# Patient Record
Sex: Male | Born: 1975 | Race: Black or African American | Hispanic: No | Marital: Single | Smoking: Never smoker
Health system: Southern US, Community
[De-identification: ages and names within clinical notes are randomized; demographics above are authoritative.]

## PROBLEM LIST (undated history)

## (undated) DIAGNOSIS — I10 Essential (primary) hypertension: Secondary | ICD-10-CM

## (undated) DIAGNOSIS — E119 Type 2 diabetes mellitus without complications: Secondary | ICD-10-CM

## (undated) DIAGNOSIS — E785 Hyperlipidemia, unspecified: Secondary | ICD-10-CM

---

## 2013-10-05 ENCOUNTER — Encounter (HOSPITAL_BASED_OUTPATIENT_CLINIC_OR_DEPARTMENT_OTHER): Payer: Self-pay | Admitting: Emergency Medicine

## 2013-10-05 ENCOUNTER — Emergency Department (HOSPITAL_BASED_OUTPATIENT_CLINIC_OR_DEPARTMENT_OTHER)
Admission: EM | Admit: 2013-10-05 | Discharge: 2013-10-05 | Disposition: A | Discharge status: Home / Self Care | Payer: Self-pay | Attending: Emergency Medicine | Admitting: Emergency Medicine

## 2013-10-05 ENCOUNTER — Emergency Department (HOSPITAL_BASED_OUTPATIENT_CLINIC_OR_DEPARTMENT_OTHER): Payer: Self-pay

## 2013-10-05 DIAGNOSIS — S71009A Unspecified open wound, unspecified hip, initial encounter: Secondary | ICD-10-CM | POA: Insufficient documentation

## 2013-10-05 DIAGNOSIS — W460XXA Contact with hypodermic needle, initial encounter: Secondary | ICD-10-CM | POA: Insufficient documentation

## 2013-10-05 DIAGNOSIS — Y9389 Activity, other specified: Secondary | ICD-10-CM | POA: Insufficient documentation

## 2013-10-05 DIAGNOSIS — S71109A Unspecified open wound, unspecified thigh, initial encounter: Principal | ICD-10-CM | POA: Insufficient documentation

## 2013-10-05 DIAGNOSIS — E119 Type 2 diabetes mellitus without complications: Secondary | ICD-10-CM | POA: Insufficient documentation

## 2013-10-05 DIAGNOSIS — Y9289 Other specified places as the place of occurrence of the external cause: Secondary | ICD-10-CM | POA: Insufficient documentation

## 2013-10-05 DIAGNOSIS — Z79899 Other long term (current) drug therapy: Secondary | ICD-10-CM | POA: Insufficient documentation

## 2013-10-05 DIAGNOSIS — T148XXA Other injury of unspecified body region, initial encounter: Secondary | ICD-10-CM

## 2013-10-05 HISTORY — DX: Type 2 diabetes mellitus without complications: E11.9

## 2013-10-05 NOTE — ED Provider Notes (Signed)
CSN: [id number]     Arrival date & time 10/05/13  1902 History   This chart was scribed for Ethelda Chick, MD, by Yevette Edwards, ED Scribe. This patient was seen in room [facility]FT2/[facility]2 and the patient's care was started at 9:06 PM.  First MD Initiated Contact with Patient 10/05/13 2014     Chief Complaint  Patient presents with  . Foreign Body in Skin    Patient is a 38 y.o. male presenting with foreign body. The history is provided by the patient. No language interpreter was used.  Foreign Body Location:  Skin Suspected object:  Metal Pain quality:  Unable to specify Pain severity:  No pain Timing:  Unable to specify Progression:  Unable to specify Chronicity:  New Worsened by:  Nothing tried Ineffective treatments:  None tried   HPI Comments: Christopherjame Amsden is a 38 y.o. male, with a h/o DM, who presents to the Emergency Department complaining of a suspected foreign body to his left deltoid which occurred this evening when the tip of a needle ostensibly fractured. He reports the needle was used for an testosterone injection.  Mr. Zou denies any pain associated with the sight.   Past Medical History  Diagnosis Date  . Diabetes mellitus without complication    History reviewed. No pertinent past surgical history. No family history on file. History  Substance Use Topics  . Smoking status: Never Smoker   . Smokeless tobacco: Not on file  . Alcohol Use: No    Review of Systems  Musculoskeletal: Negative for myalgias.  All other systems reviewed and are negative.   Allergies  Review of patient's allergies indicates no known allergies.  Home Medications   Prior to Admission medications   Medication Sig Start Date End Date Taking? Authorizing Provider  LISINOPRIL PO Take by mouth.   Yes Historical Provider, MD  METFORMIN HCL PO Take by mouth.   Yes Historical Provider, MD  TESTOSTERONE IM Inject into the muscle.   Yes Historical Provider, MD   BP 151/74  Pulse 79   Temp(Src) 98.1 F (36.7 C) (Oral)  Resp 20  SpO2 98% Vitals reviewed Physical Exam Physical Examination: General appearance - alert, well appearing, and in no distress Mental status - alert, oriented to person, place, and time Eyes - no conjunctival injection, no scleral icterus Neurological - alert, oriented,  Strength 5/5 in upper extremities, sensation intact distal to area of injection on left deltoid Musculoskeletal - no joint tenderness, deformity or swelling Extremities - peripheral pulses normal, no pedal edema, no clubbing or cyanosis Skin - normal coloration and turgor, no rashes, no erythema, no bleeding, no foreign body seen or palpated  ED Course  Procedures (including critical care time)  DIAGNOSTIC STUDIES: Oxygen Saturation is 98% on RA, normal by my interpretation.    COORDINATION OF CARE:  9:08 PM- Discussed treatment plan with patient, and the patient agreed to the plan.  The plan includes imaging.   Labs Review Labs Reviewed - No data to display  Imaging Review Dg Humerus Left  10/05/2013   CLINICAL DATA:  The patient states that a TB needle broke off in his arm while getting a testosterone injection.  EXAM: LEFT HUMERUS - 2+ VIEW  COMPARISON:  None.  FINDINGS: A small metallic marker is a marking the injection site. There is no radiopaque foreign body within the soft tissues. The underlying bones have normal appearances.  IMPRESSION: Normal examination.  No radiopaque foreign body.   Electronically Signed  By: Gordan Payment M.D.   On: 10/05/2013 19:45     EKG Interpretation None      MDM   Final diagnoses:  Foreign body in skin    Pt presenting with c/o testosterone needle in his left deltoid.  No foreign body seen or palpated under skin, no foreign body seen on xray-  Xray images reviewed and interpreted by me as well.  Pt provided with reassurance, given information for surgery followup if he develops pain, increased symptoms redness at site.   Discharged with strict return precautions.  Pt agreeable with plan.  I personally performed the services described in this documentation, which was scribed in my presence. The recorded information has been reviewed and is accurate.     Ethelda Chick, MD 10/05/13 2158

## 2013-10-05 NOTE — ED Notes (Signed)
TB needle broke off in his left deltoid while getting a Testosterone injection.

## 2013-10-05 NOTE — Discharge Instructions (Signed)
Return to the ED with any concerns including increased pain, redness, swelling, pus draining, or any other alarming symptoms

## 2013-10-05 NOTE — ED Notes (Signed)
MD at bedside. 

## 2013-11-25 ENCOUNTER — Encounter (HOSPITAL_BASED_OUTPATIENT_CLINIC_OR_DEPARTMENT_OTHER): Payer: Self-pay

## 2013-11-25 ENCOUNTER — Emergency Department (HOSPITAL_BASED_OUTPATIENT_CLINIC_OR_DEPARTMENT_OTHER)
Admission: EM | Admit: 2013-11-25 | Discharge: 2013-11-25 | Disposition: A | Discharge status: Home / Self Care | Payer: 59 | Attending: Emergency Medicine | Admitting: Emergency Medicine

## 2013-11-25 DIAGNOSIS — E119 Type 2 diabetes mellitus without complications: Secondary | ICD-10-CM | POA: Insufficient documentation

## 2013-11-25 DIAGNOSIS — I10 Essential (primary) hypertension: Secondary | ICD-10-CM | POA: Insufficient documentation

## 2013-11-25 DIAGNOSIS — Z760 Encounter for issue of repeat prescription: Secondary | ICD-10-CM | POA: Insufficient documentation

## 2013-11-25 HISTORY — DX: Essential (primary) hypertension: I10

## 2013-11-25 HISTORY — DX: Hyperlipidemia, unspecified: E78.5

## 2013-11-25 LAB — CBG MONITORING, ED: GLUCOSE-CAPILLARY: 272 mg/dL — AB (ref 70–99)

## 2013-11-25 MED ORDER — LISINOPRIL 10 MG PO TABS: 10.0000 mg | ORAL_TABLET | Freq: Every day | ORAL | Status: DC

## 2013-11-25 MED ORDER — PRAVASTATIN SODIUM 20 MG PO TABS: 20.0000 mg | ORAL_TABLET | Freq: Every day | ORAL | Status: DC

## 2013-11-25 MED ORDER — METFORMIN HCL 850 MG PO TABS: 850.0000 mg | ORAL_TABLET | Freq: Two times a day (BID) | ORAL | Status: DC

## 2013-11-25 NOTE — ED Provider Notes (Signed)
TIME SEEN: 10:10 AM  CHIEF COMPLAINT: medication refill  HPI: Pt is a 38 y.o. M with history of hypertension, non-insulin-dependent diabetes, hyperlipidemia who presents to the emergency department requesting refills of his medications. He reports he has not been taking them for the past several days. He reports that he ran out of these medications and because he does not have insurance cannot go back to see his primary care physician for refill without paying out of pocket. He denies any complaints. No headaches, vision changes, chest pain or shortness of breath, vomiting or diarrhea, numbness, tingling or focal weakness.  ROS: See HPI Constitutional: no fever  Eyes: no drainage  ENT: no runny nose   Cardiovascular:  no chest pain  Resp: no SOB  GI: no vomiting GU: no dysuria Integumentary: no rash  Allergy: no hives  Musculoskeletal: no leg swelling  Neurological: no slurred speech ROS otherwise negative  PAST MEDICAL HISTORY/PAST SURGICAL HISTORY:  Past Medical History  Diagnosis Date  . Diabetes mellitus without complication   . Hypertension   . Hyperlipidemia     MEDICATIONS:  Prior to Admission medications   Medication Sig Start Date End Date Taking? Authorizing Provider  LISINOPRIL PO Take by mouth.   Yes Historical Provider, MD  METFORMIN HCL PO Take by mouth.   Yes Historical Provider, MD  TESTOSTERONE IM Inject into the muscle.   Yes Historical Provider, MD    ALLERGIES:  No Known Allergies  SOCIAL HISTORY:  History  Substance Use Topics  . Smoking status: Never Smoker   . Smokeless tobacco: Never Used  . Alcohol Use: Yes     Comment: Pt stated that he drinks 3 or 4 heinekins every 4-6 months with friends    FAMILY HISTORY: History reviewed. No pertinent family history.  EXAM: BP 161/92 mmHg  Pulse 84  Temp(Src) 98.4 F (36.9 C) (Oral)  Resp 16  Ht 6\' 2"  (1.88 m)  Wt 270 lb (122.471 kg)  BMI 34.65 kg/m2  SpO2 99% CONSTITUTIONAL: Alert and  oriented and responds appropriately to questions. Well-appearing; well-nourished HEAD: Normocephalic EYES: Conjunctivae clear, PERRL ENT: normal nose; no rhinorrhea; moist mucous membranes; pharynx without lesions noted NECK: Supple, no meningismus, no LAD  CARD: RRR; S1 and S2 appreciated; no murmurs, no clicks, no rubs, no gallops RESP: Normal chest excursion without splinting or tachypnea; breath sounds clear and equal bilaterally; no wheezes, no rhonchi, no rales,  ABD/GI: Normal bowel sounds; non-distended; soft, non-tender, no rebound, no guarding BACK:  The back appears normal and is non-tender to palpation, there is no CVA tenderness EXT: Normal ROM in all joints; non-tender to palpation; no edema; normal capillary refill; no cyanosis    SKIN: Normal color for age and race; warm NEURO: Moves all extremities equally; sensation to light touch intact diffusely, cranial nerves II-12 intact, normal gait PSYCH: The patient's mood and manner are appropriate. Grooming and personal hygiene are appropriate.  MEDICAL DECISION MAKING: Pt here without symptoms requesting medication refill. He is mildly hypertensive and hyperglycemic but is asymptomatic. Nursing staff has called his pharmacy - it appears patient has not picked up his metformin 850 mg once daily or lisinopril 10 mg once daily since August 19 and he has not picked up her prescription for his pravastatin 20 mg at bedtime since May 14. Discussed with patient the importance of following up with his primary care physician to have these medications the future and the role of the emergency department. Discussed return precautions. He verbalizes understanding  and is with plan.       Layla MawKristen N Anushri Casalino, DO 11/25/13 1029

## 2013-11-25 NOTE — Discharge Instructions (Signed)
Medication Refill, Emergency Department We have refilled your medication today as a courtesy to you. It is best for your medical care, however, to take care of getting refills done through your primary caregiver's office. They have your records and can do a better job of follow-up than we can in the emergency department. On maintenance medications, we often only prescribe enough medications to get you by until you are able to see your regular caregiver. This is a more expensive way to refill medications. In the future, please plan for refills so that you will not have to use the emergency department for this. Thank you for your help. Your help allows [place] to better take care of the daily emergencies that enter our department. Document Released: 04/27/2003 Document Revised: 04/02/2011 Document Reviewed: 04/17/2013 [facility] Patient Information 2015 [place], [place]. This information is not intended to replace advice given to you by your health care provider. Make sure you discuss any questions you have with your health care provider.   Hypertension Hypertension, commonly called high blood pressure, is when the force of blood pumping through your arteries is too strong. Your arteries are the blood vessels that carry blood from your heart throughout your body. A blood pressure reading consists of a higher number over a lower number, such as 110/72. The higher number (systolic) is the pressure inside your arteries when your heart pumps. The lower number (diastolic) is the pressure inside your arteries when your heart relaxes. Ideally you want your blood pressure below 120/80. Hypertension forces your heart to work harder to pump blood. Your arteries may become narrow or stiff. Having hypertension puts you at risk for heart disease, stroke, and other problems.  RISK FACTORS Some risk factors for high blood pressure are controllable. Others are not.  Risk factors you cannot control include:   Race. You may be  at higher risk if you are African American.  Age. Risk increases with age.  Gender. Men are at higher risk than women before age 38 years. After age 47, women are at higher risk than men. Risk factors you can control include:  Not getting enough exercise or physical activity.  Being overweight.  Getting too much fat, sugar, calories, or salt in your diet.  Drinking too much alcohol. SIGNS AND SYMPTOMS Hypertension does not usually cause signs or symptoms. Extremely high blood pressure (hypertensive crisis) may cause headache, anxiety, shortness of breath, and nosebleed. DIAGNOSIS  To check if you have hypertension, your health care provider will measure your blood pressure while you are seated, with your arm held at the level of your heart. It should be measured at least twice using the same arm. Certain conditions can cause a difference in blood pressure between your right and left arms. A blood pressure reading that is higher than normal on one occasion does not mean that you need treatment. If one blood pressure reading is high, ask your health care provider about having it checked again. TREATMENT  Treating high blood pressure includes making lifestyle changes and possibly taking medicine. Living a healthy lifestyle can help lower high blood pressure. You may need to change some of your habits. Lifestyle changes may include:  Following the DASH diet. This diet is high in fruits, vegetables, and whole grains. It is low in salt, red meat, and added sugars.  Getting at least 2 hours of brisk physical activity every week.  Losing weight if necessary.  Not smoking.  Limiting alcoholic beverages.  Learning ways to reduce stress.  If lifestyle changes are not enough to get your blood pressure under control, your health care provider may prescribe medicine. You may need to take more than one. Work closely with your health care provider to understand the risks and benefits. HOME CARE  INSTRUCTIONS  Have your blood pressure rechecked as directed by your health care provider.   Take medicines only as directed by your health care provider. Follow the directions carefully. Blood pressure medicines must be taken as prescribed. The medicine does not work as well when you skip doses. Skipping doses also puts you at risk for problems.   Do not smoke.   Monitor your blood pressure at home as directed by your health care provider. SEEK MEDICAL CARE IF:   You think you are having a reaction to medicines taken.  You have recurrent headaches or feel dizzy.  You have swelling in your ankles.  You have trouble with your vision. SEEK IMMEDIATE MEDICAL CARE IF:  You develop a severe headache or confusion.  You have unusual weakness, numbness, or feel faint.  You have severe chest or abdominal pain.  You vomit repeatedly.  You have trouble breathing. MAKE SURE YOU:   Understand these instructions.  Will watch your condition.  Will get help right away if you are not doing well or get worse. Document Released: 01/08/2005 Document Revised: 05/25/2013 Document Reviewed: 10/31/2012 [facility] Patient Information 2015 [place], [place]. This information is not intended to replace advice given to you by your health care provider. Make sure you discuss any questions you have with your health care provider.   Type 2 Diabetes Mellitus Type 2 diabetes mellitus, often simply referred to as type 2 diabetes, is a long-lasting (chronic) disease. In type 2 diabetes, the pancreas does not make enough insulin (a hormone), the cells are less responsive to the insulin that is made (insulin resistance), or both. Normally, insulin moves sugars from food into the tissue cells. The tissue cells use the sugars for energy. The lack of insulin or the lack of normal response to insulin causes excess sugars to build up in the blood instead of going into the tissue cells. As a result, high blood sugar  (hyperglycemia) develops. The effect of high sugar (glucose) levels can cause many complications. Type 2 diabetes was also previously called adult-onset diabetes, but it can occur at any age.  RISK FACTORS  A person is predisposed to developing type 2 diabetes if someone in the family has the disease and also has one or more of the following primary risk factors:  Overweight.  An inactive lifestyle.  A history of consistently eating high-calorie foods. Maintaining a normal weight and regular physical activity can reduce the chance of developing type 2 diabetes. SYMPTOMS  A person with type 2 diabetes may not show symptoms initially. The symptoms of type 2 diabetes appear slowly. The symptoms include:  Increased thirst (polydipsia).  Increased urination (polyuria).  Increased urination during the night (nocturia).  Weight loss. This weight loss may be rapid.  Frequent, recurring infections.  Tiredness (fatigue).  Weakness.  Vision changes, such as blurred vision.  Fruity smell to your breath.  Abdominal pain.  Nausea or vomiting.  Cuts or bruises which are slow to heal.  Tingling or numbness in the hands or feet. DIAGNOSIS Type 2 diabetes is frequently not diagnosed until complications of diabetes are present. Type 2 diabetes is diagnosed when symptoms or complications are present and when blood glucose levels are increased. Your blood glucose level may  be checked by one or more of the following blood tests:  A fasting blood glucose test. You will not be allowed to eat for at least 8 hours before a blood sample is taken.  A random blood glucose test. Your blood glucose is checked at any time of the day regardless of when you ate.  A hemoglobin A1c blood glucose test. A hemoglobin A1c test provides information about blood glucose control over the previous 3 months.  An oral glucose tolerance test (OGTT). Your blood glucose is measured after you have not eaten (fasted)  for 2 hours and then after you drink a glucose-containing beverage. TREATMENT   You may need to take insulin or diabetes medicine daily to keep blood glucose levels in the desired range.  If you use insulin, you may need to adjust the dosage depending on the carbohydrates that you eat with each meal or snack. The treatment goal is to maintain the before meal blood sugar (preprandial glucose) level at 70-130 mg/dL. HOME CARE INSTRUCTIONS   Have your hemoglobin A1c level checked twice a year.  Perform daily blood glucose monitoring as directed by your health care provider.  Monitor urine ketones when you are ill and as directed by your health care provider.  Take your diabetes medicine or insulin as directed by your health care provider to maintain your blood glucose levels in the desired range.  Never run out of diabetes medicine or insulin. It is needed every day.  If you are using insulin, you may need to adjust the amount of insulin given based on your intake of carbohydrates. Carbohydrates can raise blood glucose levels but need to be included in your diet. Carbohydrates provide vitamins, minerals, and fiber which are an essential part of a healthy diet. Carbohydrates are found in fruits, vegetables, whole grains, dairy products, legumes, and foods containing added sugars.  Eat healthy foods. You should make an appointment to see a registered dietitian to help you create an eating plan that is right for you.  Lose weight if you are overweight.  Carry a medical alert card or wear your medical alert jewelry.  Carry a 15-gram carbohydrate snack with you at all times to treat low blood glucose (hypoglycemia). Some examples of 15-gram carbohydrate snacks include:  Glucose tablets, 3 or 4.  Glucose gel, 15-gram tube.  Raisins, 2 tablespoons (24 grams).  Jelly beans, 6.  Animal crackers, 8.  Regular pop, 4 ounces (120 mL).  Gummy treats, 9.  Recognize hypoglycemia. Hypoglycemia  occurs with blood glucose levels of 70 mg/dL and below. The risk for hypoglycemia increases when fasting or skipping meals, during or after intense exercise, and during sleep. Hypoglycemia symptoms can include:  Tremors or shakes.  Decreased ability to concentrate.  Sweating.  Increased heart rate.  Headache.  Dry mouth.  Hunger.  Irritability.  Anxiety.  Restless sleep.  Altered speech or coordination.  Confusion.  Treat hypoglycemia promptly. If you are alert and able to safely swallow, follow the 15:15 rule:  Take 15-20 grams of rapid-acting glucose or carbohydrate. Rapid-acting options include glucose gel, glucose tablets, or 4 ounces (120 mL) of fruit juice, regular soda, or low-fat milk.  Check your blood glucose level 15 minutes after taking the glucose.  Take 15-20 grams more of glucose if the repeat blood glucose level is still 70 mg/dL or below.  Eat a meal or snack within 1 hour once blood glucose levels return to normal.  Be alert to feeling very thirsty and urinating  more frequently than usual, which are early signs of hyperglycemia. An early awareness of hyperglycemia allows for prompt treatment. Treat hyperglycemia as directed by your health care provider.  Engage in at least 150 minutes of moderate-intensity physical activity a week, spread over at least 3 days of the week or as directed by your health care provider. In addition, you should engage in resistance exercise at least 2 times a week or as directed by your health care provider. Try to spend no more than 90 minutes at one time inactive.  Adjust your medicine and food intake as needed if you start a new exercise or sport.  Follow your sick-day plan anytime you are unable to eat or drink as usual.  Do not use any tobacco products including cigarettes, chewing tobacco, or electronic cigarettes. If you need help quitting, ask your health care provider.  Limit alcohol intake to no more than 1 drink  per day for nonpregnant women and 2 drinks per day for men. You should drink alcohol only when you are also eating food. Talk with your health care provider whether alcohol is safe for you. Tell your health care provider if you drink alcohol several times a week.  Keep all follow-up visits as directed by your health care provider. This is important.  Schedule an eye exam soon after the diagnosis of type 2 diabetes and then annually.  Perform daily skin and foot care. Examine your skin and feet daily for cuts, bruises, redness, nail problems, bleeding, blisters, or sores. A foot exam by a health care provider should be done annually.  Brush your teeth and gums at least twice a day and floss at least once a day. Follow up with your dentist regularly.  Share your diabetes management plan with your workplace or school.  Stay up-to-date with immunizations. It is recommended that people with diabetes who are over 89 years old get the pneumonia vaccine. In some cases, two separate shots may be given. Ask your health care provider if your pneumonia vaccination is up-to-date.  Learn to manage stress.  Obtain ongoing diabetes education and support as needed.  Participate in or seek rehabilitation as needed to maintain or improve independence and quality of life. Request a physical or occupational therapy referral if you are having foot or hand numbness, or difficulties with grooming, dressing, eating, or physical activity. SEEK MEDICAL CARE IF:   You are unable to eat food or drink fluids for more than 6 hours.  You have nausea and vomiting for more than 6 hours.  Your blood glucose level is over 240 mg/dL.  There is a change in mental status.  You develop an additional serious illness.  You have diarrhea for more than 6 hours.  You have been sick or have had a fever for a couple of days and are not getting better.  You have pain during any physical activity.  SEEK IMMEDIATE MEDICAL CARE  IF:  You have difficulty breathing.  You have moderate to large ketone levels. MAKE SURE YOU:  Understand these instructions.  Will watch your condition.  Will get help right away if you are not doing well or get worse. Document Released: 01/08/2005 Document Revised: 05/25/2013 Document Reviewed: 08/07/2011 [facility] Patient Information 2015 [place], [place]. This information is not intended to replace advice given to you by your health care provider. Make sure you discuss any questions you have with your health care provider.   High Cholesterol High cholesterol refers to having a high level  of cholesterol in your blood. Cholesterol is a white, waxy, fat-like protein that your body needs in small amounts. Your liver makes all the cholesterol you need. Excess cholesterol comes from the food you eat. Cholesterol travels in your bloodstream through your blood vessels. If you have high cholesterol, deposits (plaque) may build up on the walls of your blood vessels. This makes the arteries narrower and stiffer. Plaque increases your risk of heart attack and stroke. Work with your health care provider to keep your cholesterol levels in a healthy range. RISK FACTORS Several things can make you more likely to have high cholesterol. These include:   Eating foods high in animal fat (saturated fat) or cholesterol.  Being overweight.  Not getting enough exercise.  Having a family history of high cholesterol. SIGNS AND SYMPTOMS High cholesterol does not cause symptoms. DIAGNOSIS  Your health care provider can do a blood test to check whether you have high cholesterol. If you are older than 20, your health care provider may check your cholesterol every 4-6 years. You may be checked more often if you already have high cholesterol or other risk factors for heart disease. The blood test for cholesterol measures the following:  Bad cholesterol (LDL cholesterol). This is the type of cholesterol that  causes heart disease. This number should be less than 100.  Good cholesterol (HDL cholesterol). This type helps protect against heart disease. A healthy level of HDL cholesterol is 60 or higher.  Total cholesterol. This is the combined number of LDL cholesterol and HDL cholesterol. A healthy number is less than 200. TREATMENT  High cholesterol can be treated with diet changes, lifestyle changes, and medicine.   Diet changes may include eating more whole grains, fruits, vegetables, nuts, and fish. You may also have to cut back on red meat and foods with a lot of added sugar.  Lifestyle changes may include getting at least 40 minutes of aerobic exercise three times a week. Aerobic exercises include walking, biking, and swimming. Aerobic exercise along with a healthy diet can help you maintain a healthy weight. Lifestyle changes may also include quitting smoking.  If diet and lifestyle changes are not enough to lower your cholesterol, your health care provider may prescribe a statin medicine. This medicine has been shown to lower cholesterol and also lower the risk of heart disease. HOME CARE INSTRUCTIONS  Only take over-the-counter or prescription medicines as directed by your health care provider.   Follow a healthy diet as directed by your health care provider. For instance:   Eat chicken (without skin), fish, veal, shellfish, ground [place]key breast, and round or loin cuts of red meat.  Do not eat fried foods and fatty meats, such as hot dogs and salami.   Eat plenty of fruits, such as apples.   Eat plenty of vegetables, such as broccoli, potatoes, and carrots.   Eat beans, peas, and lentils.   Eat grains, such as barley, rice, couscous, and bulgur wheat.   Eat pasta without cream sauces.   Use skim or nonfat milk and low-fat or nonfat yogurt and cheeses. Do not eat or drink whole milk, cream, ice cream, egg yolks, and hard cheeses.   Do not eat stick margarine or tub  margarines that contain trans fats (also called partially hydrogenated oils).   Do not eat cakes, cookies, crackers, or other baked goods that contain trans fats.   Do not eat saturated tropical oils, such as coconut and palm oil.   Exercise as directed  by your health care provider. Increase your activity level with activities such as gardening or walking.   Keep all follow-up appointments.  SEEK MEDICAL CARE IF:  You are struggling to maintain a healthy diet or weight.  You need help starting an exercise program.  You need help to stop smoking. SEEK IMMEDIATE MEDICAL CARE IF:  You have chest pain.  You have trouble breathing. Document Released: 01/08/2005 Document Revised: 05/25/2013 Document Reviewed: 10/31/2012 [facility] Patient Information 2015 [place]. This information is not intended to replace advice given to you by your health care provider. Make sure you discuss any questions you have with your health care provider.

## 2013-11-25 NOTE — ED Notes (Signed)
Pt stated that he ran out of his metformin and lisonopril x3 days ago. Pt stated that he just needs medication refill. Pt in no acute distress.

## 2014-01-24 ENCOUNTER — Encounter (HOSPITAL_BASED_OUTPATIENT_CLINIC_OR_DEPARTMENT_OTHER): Payer: Self-pay

## 2014-01-24 ENCOUNTER — Emergency Department (HOSPITAL_BASED_OUTPATIENT_CLINIC_OR_DEPARTMENT_OTHER)
Admission: EM | Admit: 2014-01-24 | Discharge: 2014-01-24 | Disposition: A | Discharge status: Home / Self Care | Payer: 59 | Attending: Emergency Medicine | Admitting: Emergency Medicine

## 2014-01-24 DIAGNOSIS — E785 Hyperlipidemia, unspecified: Secondary | ICD-10-CM | POA: Insufficient documentation

## 2014-01-24 DIAGNOSIS — I10 Essential (primary) hypertension: Secondary | ICD-10-CM | POA: Insufficient documentation

## 2014-01-24 DIAGNOSIS — E119 Type 2 diabetes mellitus without complications: Secondary | ICD-10-CM | POA: Insufficient documentation

## 2014-01-24 DIAGNOSIS — Z79899 Other long term (current) drug therapy: Secondary | ICD-10-CM | POA: Insufficient documentation

## 2014-01-24 LAB — CBG MONITORING, ED: GLUCOSE-CAPILLARY: 305 mg/dL — AB (ref 70–99)

## 2014-01-24 MED ORDER — METFORMIN HCL 850 MG PO TABS: 850.0000 mg | ORAL_TABLET | Freq: Two times a day (BID) | ORAL | Status: AC

## 2014-01-24 MED ORDER — PRAVASTATIN SODIUM 20 MG PO TABS: 20.0000 mg | ORAL_TABLET | Freq: Every day | ORAL | Status: AC

## 2014-01-24 MED ORDER — METFORMIN HCL 500 MG PO TABS
1000.0000 mg | ORAL_TABLET | Freq: Once | ORAL | Status: AC
  Administered 2014-01-24: 1000 mg via ORAL
  Filled 2014-01-24: qty 2

## 2014-01-24 MED ORDER — LISINOPRIL 10 MG PO TABS: 10.0000 mg | ORAL_TABLET | Freq: Every day | ORAL | Status: AC

## 2014-01-24 NOTE — ED Notes (Signed)
rx x 3 given for pravastatin, lisinopril and metformin

## 2014-01-24 NOTE — Discharge Instructions (Signed)
Take your meds as prescribed.  Follow up with your doctor.   Return to ER if you have fever, vomiting, severe abdominal pain, chest pain.

## 2014-01-24 NOTE — ED Notes (Signed)
Patient here for medication refill. Out of BP and Diabetic medication. No complaints

## 2014-01-24 NOTE — ED Provider Notes (Signed)
CSN: [id number]     Arrival date & time 01/24/14  1243 History   First MD Initiated Contact with Patient 01/24/14 1259     Chief Complaint  Patient presents with  . med refill      (Consider location/radiation/quality/duration/timing/severity/associated sxs/prior Treatment) The history is provided by the patient.  Beauford Coopman is a 39 y.o. male hx of DM, HTN, HL here with medication refill. Patient has some issues with insurance last year and his primary care doctor was unable to see him due to insurance issues. He recently ran out of his metformin and blood pressure medicines. Denies any fevers or chills or abdominal pain or vomiting. Denies any chest pain or shortness of breath.   Past Medical History  Diagnosis Date  . Diabetes mellitus without complication   . Hypertension   . Hyperlipidemia    History reviewed. No pertinent past surgical history. No family history on file. History  Substance Use Topics  . Smoking status: Never Smoker   . Smokeless tobacco: Never Used  . Alcohol Use: Yes     Comment: Pt stated that he drinks 3 or 4 heinekins every 4-6 months with friends    Review of Systems  Gastrointestinal: Negative for vomiting and abdominal pain.  All other systems reviewed and are negative.     Allergies  Review of patient's allergies indicates no known allergies.  Home Medications   Prior to Admission medications   Medication Sig Start Date End Date Taking? Authorizing Provider  lisinopril (PRINIVIL,ZESTRIL) 10 MG tablet Take 1 tablet (10 mg total) by mouth daily. 11/25/13   Kristen N Ward, DO  LISINOPRIL PO Take 20 mg by mouth.     Historical Provider, MD  metFORMIN (GLUCOPHAGE) 850 MG tablet Take 1 tablet (850 mg total) by mouth 2 (two) times daily with a meal. 11/25/13   Kristen N Ward, DO  METFORMIN HCL PO Take 850 mg by mouth.     Historical Provider, MD  pravastatin (PRAVACHOL) 20 MG tablet Take 1 tablet (20 mg total) by mouth daily. 11/25/13   Kristen N  Ward, DO  pravastatin (PRAVACHOL) 20 MG tablet Take 20 mg by mouth at bedtime.    Historical Provider, MD  TESTOSTERONE IM Inject into the muscle.    Historical Provider, MD   BP 145/82 mmHg  Pulse 66  Temp(Src) 98.1 F (36.7 C) (Oral)  Resp 20  Ht  (1.88 m)  Wt 275 lb (124.739 kg)  BMI 35.29 kg/m2  SpO2 98% Physical Exam  Constitutional: He is oriented to person, place, and time. He appears well-developed and well-nourished.  HENT:  Head: Normocephalic.  Mouth/Throat: Oropharynx is clear and moist.  Eyes: Conjunctivae are normal. Pupils are equal, round, and reactive to light.  Neck: Normal range of motion. Neck supple.  Cardiovascular: Normal rate, regular rhythm and normal heart sounds.   Pulmonary/Chest: Effort normal and breath sounds normal. No respiratory distress. He has no wheezes. He has no rales.  Abdominal: Soft. Bowel sounds are normal. He exhibits no distension. There is no tenderness.  Musculoskeletal: Normal range of motion.  Neurological: He is alert and oriented to person, place, and time. No cranial nerve deficit. Coordination normal.  Skin: Skin is dry.  Psychiatric: He has a normal mood and affect. His behavior is normal. Judgment and thought content normal.  Nursing note and vitals reviewed.   ED Course  Procedures (including critical care time) Labs Review Labs Reviewed  CBG MONITORING, ED - Abnormal; Notable for  the following:    Glucose-Capillary 305 (*)    All other components within normal limits    Imaging Review No results found.   EKG Interpretation None      MDM   Final diagnoses:  None    Taeshaun Mayotte is a 39 y.o. male here with medication refill. CBG 305. But no fever or ab pain or vomiting so I doubt DKA. Will refill meds. Has insurance now and hopefully will be able to f/u with PMD.     Richardean Canal, MD 01/24/14 1321

## 2016-04-30 IMAGING — CR DG HUMERUS 2V *L*
2 series · 2 of 2 positions shown · non-contrast
Comparison: None.

CLINICAL DATA: The patient states that a TB needle broke off in his
arm while getting a testosterone injection.

EXAM:
LEFT HUMERUS - 2+ VIEW

[w humerus ap left *]
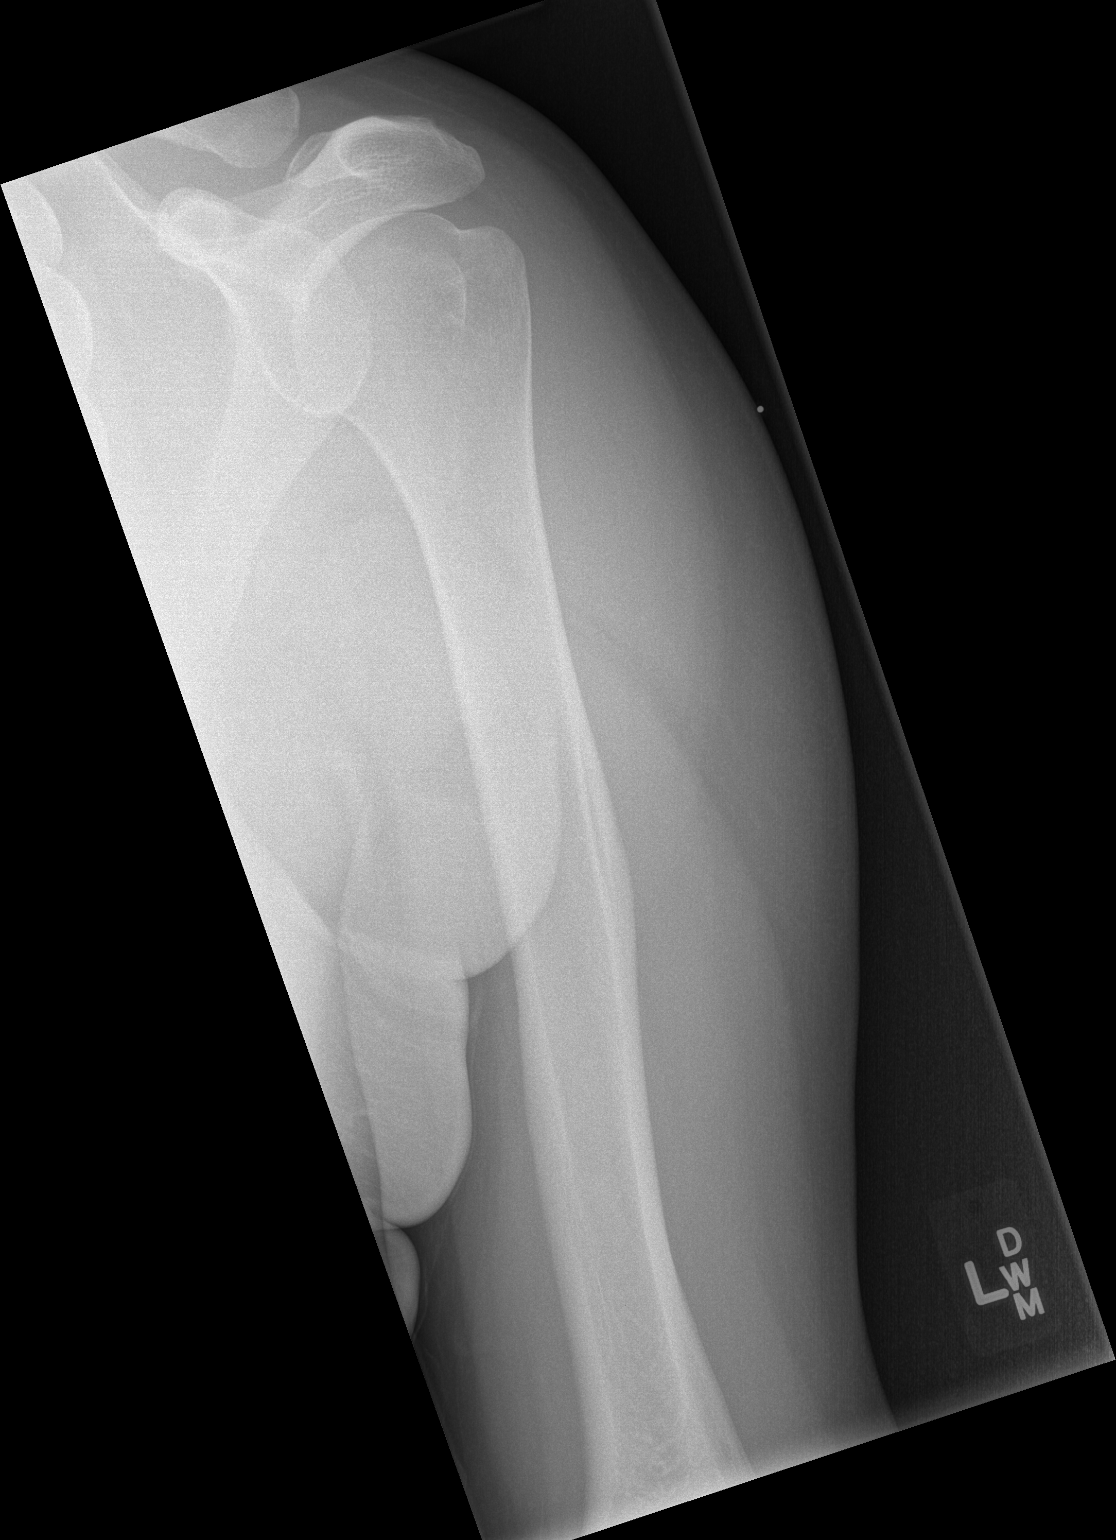

[w humerus lat left *]
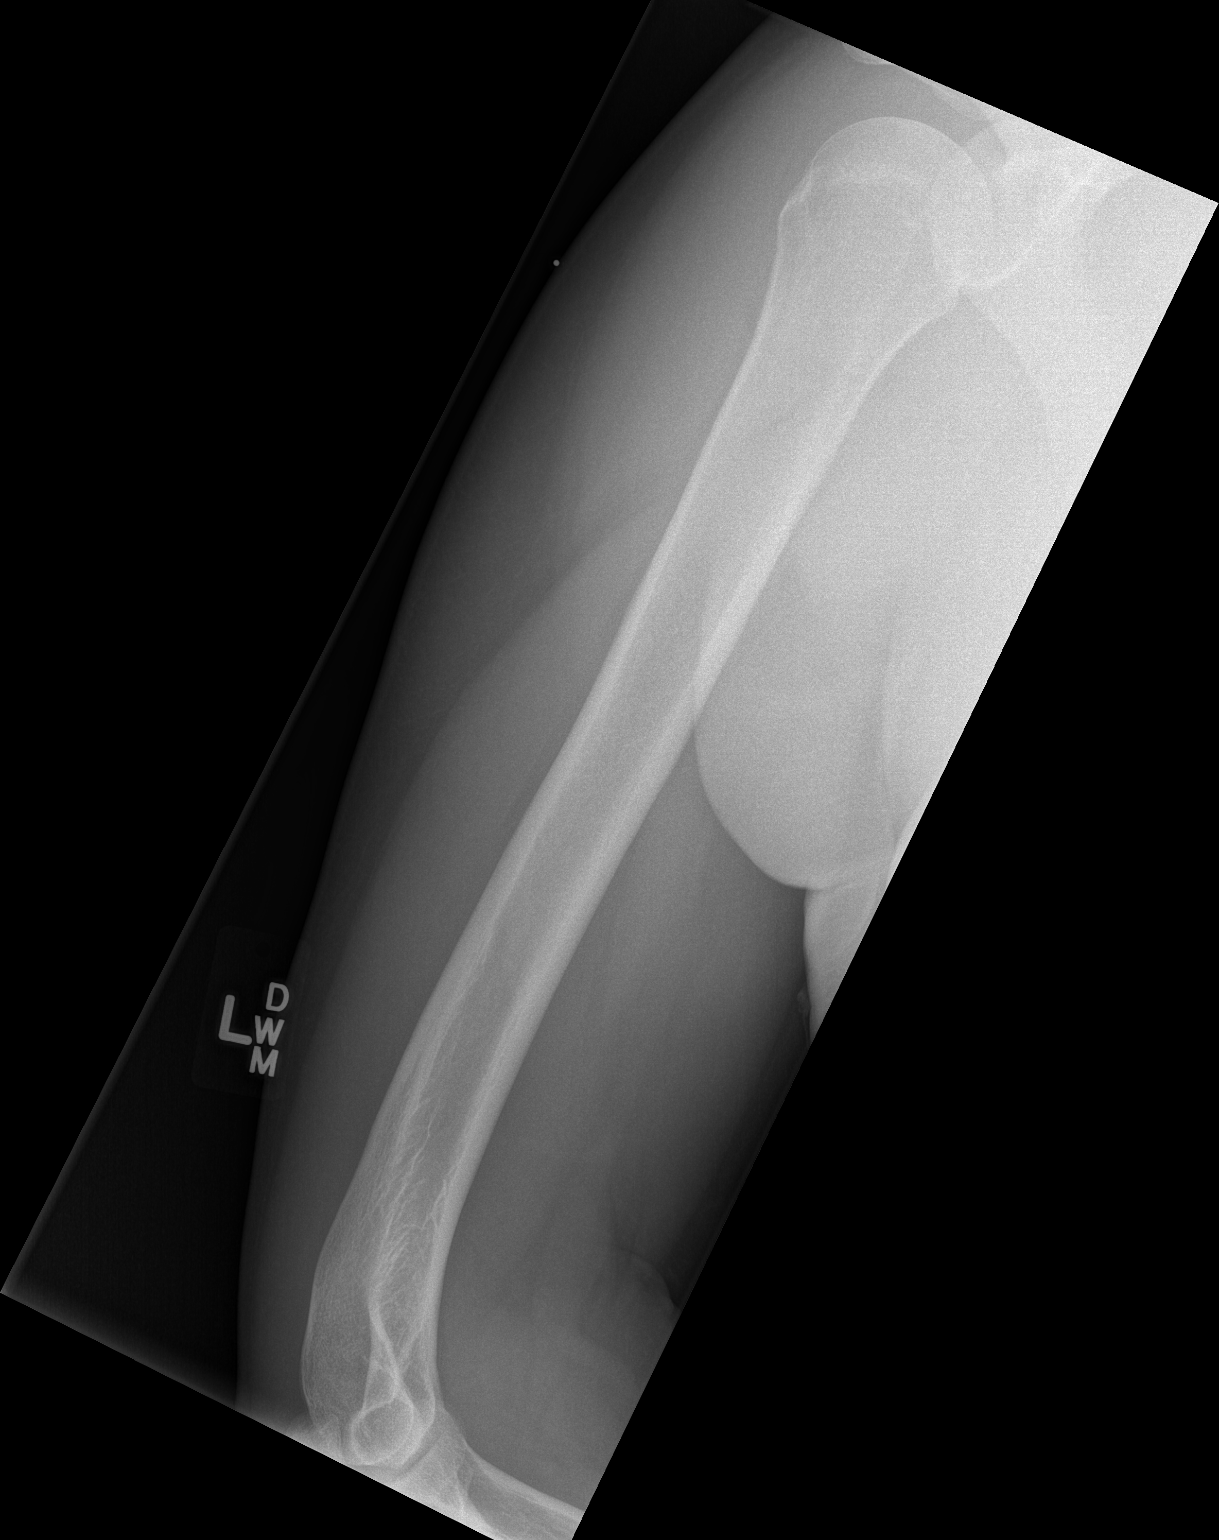

[2 of 2 positions shown; findings below may reference images not displayed]

FINDINGS: A small metallic marker is a marking the injection site. There is no
radiopaque foreign body within the soft tissues. The underlying
bones have normal appearances.
IMPRESSION: Normal examination.  No radiopaque foreign body.

## 2018-02-10 ENCOUNTER — Emergency Department (HOSPITAL_BASED_OUTPATIENT_CLINIC_OR_DEPARTMENT_OTHER)
Admission: EM | Admit: 2018-02-10 | Discharge: 2018-02-10 | Disposition: A | Discharge status: Home / Self Care | Payer: Self-pay | Attending: Emergency Medicine | Admitting: Emergency Medicine

## 2018-02-10 ENCOUNTER — Other Ambulatory Visit: Payer: Self-pay

## 2018-02-10 ENCOUNTER — Encounter (HOSPITAL_BASED_OUTPATIENT_CLINIC_OR_DEPARTMENT_OTHER): Payer: Self-pay

## 2018-02-10 DIAGNOSIS — I10 Essential (primary) hypertension: Secondary | ICD-10-CM | POA: Insufficient documentation

## 2018-02-10 DIAGNOSIS — Y999 Unspecified external cause status: Secondary | ICD-10-CM | POA: Insufficient documentation

## 2018-02-10 DIAGNOSIS — Z79899 Other long term (current) drug therapy: Secondary | ICD-10-CM | POA: Insufficient documentation

## 2018-02-10 DIAGNOSIS — Y929 Unspecified place or not applicable: Secondary | ICD-10-CM | POA: Insufficient documentation

## 2018-02-10 DIAGNOSIS — Y939 Activity, unspecified: Secondary | ICD-10-CM | POA: Insufficient documentation

## 2018-02-10 DIAGNOSIS — Z7984 Long term (current) use of oral hypoglycemic drugs: Secondary | ICD-10-CM | POA: Insufficient documentation

## 2018-02-10 DIAGNOSIS — E119 Type 2 diabetes mellitus without complications: Secondary | ICD-10-CM | POA: Insufficient documentation

## 2018-02-10 DIAGNOSIS — S39012A Strain of muscle, fascia and tendon of lower back, initial encounter: Secondary | ICD-10-CM | POA: Insufficient documentation

## 2018-02-10 DIAGNOSIS — X500XXA Overexertion from strenuous movement or load, initial encounter: Secondary | ICD-10-CM | POA: Insufficient documentation

## 2018-02-10 MED ORDER — IBUPROFEN 800 MG PO TABS: 800.0000 mg | ORAL_TABLET | Freq: Three times a day (TID) | ORAL | 0 refills | Status: DC | PRN

## 2018-02-10 MED ORDER — METHOCARBAMOL 500 MG PO TABS: 500.0000 mg | ORAL_TABLET | Freq: Three times a day (TID) | ORAL | 0 refills | Status: AC | PRN

## 2018-02-10 NOTE — ED Triage Notes (Signed)
C/o lower back pain started while moving desk 2 days ago-NAD-slow gait

## 2018-02-10 NOTE — Discharge Instructions (Addendum)
You were seen in the emergency department for some low back pain after lifting.  This is likely muscular and will take a few days or even a few weeks to heal.  We are prescribing you some ibuprofen and a muscle relaxant.  You should do some gentle stretching.  Follow-up with your doctor.

## 2018-02-10 NOTE — ED Provider Notes (Signed)
[facility] EMERGENCY DEPARTMENT Provider Note   CSN: [id number] Arrival date & time: 02/10/18  1847     History   Chief Complaint Chief Complaint  Patient presents with  . Back Pain    HPI Randy Powers is a 43 y.o. male.  He is complaining about 2 days of low back pain after helping move a lot of heavy stuff.  Says moderate intensity sharp and increases with any twisting or lifting.  Does not radiate in his abdomen or down his legs.  No numbness or tingling.  No bowel or bladder incontinence.  He was unable to stay at work today because of the pain so is here for a note and's medication.  The history is provided by the patient.  Back Pain  Pain location: paralumbar left. Quality:  Shooting and stabbing Radiates to:  Does not radiate Pain severity:  Moderate Onset quality:  Gradual Timing:  Constant Progression:  Unchanged Chronicity:  New Context: lifting heavy objects   Relieved by:  Nothing Worsened by:  Movement and bending Ineffective treatments:  Heating pad Associated symptoms: no abdominal pain, no bladder incontinence, no bowel incontinence, no chest pain, no dysuria, no fever, no leg pain, no tingling and no weakness     Past Medical History:  Diagnosis Date  . Diabetes mellitus without complication (HCC)   . Hyperlipidemia   . Hypertension     There are no active problems to display for this patient.   History reviewed. No pertinent surgical history.      Home Medications    Prior to Admission medications   Medication Sig Start Date End Date Taking? Authorizing Provider  lisinopril (PRINIVIL,ZESTRIL) 10 MG tablet Take 1 tablet (10 mg total) by mouth daily. 01/24/14   Charlynne Pander, MD  metFORMIN (GLUCOPHAGE) 850 MG tablet Take 1 tablet (850 mg total) by mouth 2 (two) times daily with a meal. 01/24/14   Charlynne Pander, MD  pravastatin (PRAVACHOL) 20 MG tablet Take 1 tablet (20 mg total) by mouth at bedtime. 01/24/14   Charlynne Pander, MD  TESTOSTERONE IM Inject into the muscle.    [provider]    Family History No family history on file.  Social History Social History   Tobacco Use  . Smoking status: Never Smoker  . Smokeless tobacco: Never Used  Substance Use Topics  . Alcohol use: Yes    Comment: occ  . Drug use: No     Allergies   Patient has no known allergies.   Review of Systems Review of Systems  Constitutional: Negative for fever.  HENT: Negative for sore throat.   Eyes: Negative for visual disturbance.  Respiratory: Negative for shortness of breath.   Cardiovascular: Negative for chest pain.  Gastrointestinal: Negative for abdominal pain and bowel incontinence.  Genitourinary: Negative for bladder incontinence and dysuria.  Musculoskeletal: Positive for back pain.  Skin: Negative for rash.  Neurological: Negative for tingling and weakness.     Physical Exam Updated Vital Signs BP 135/73 (BP Location: Left Arm)   Pulse 92   Temp 98.6 F (37 C) (Oral)   Resp 18   Ht 6\' 2"  (1.88 m)   Wt 122.5 kg   SpO2 98%   BMI 34.67 kg/m   Physical Exam Vitals signs and nursing note reviewed.  Constitutional:      Appearance: He is well-developed.  HENT:     Head: Normocephalic and atraumatic.  Eyes:     Conjunctiva/sclera: Conjunctivae  normal.  Neck:     Musculoskeletal: Neck supple.  Cardiovascular:     Rate and Rhythm: Normal rate and regular rhythm.  Pulmonary:     Effort: Pulmonary effort is normal.  Abdominal:     General: Abdomen is flat.     Palpations: There is no mass.     Hernia: No hernia is present.  Musculoskeletal:        General: No deformity.     Comments: He has no midline back tenderness.  He has reproducible left paralumbar tenderness and spasm.  Normal gait.  Strength and sensation intact to light touch.  Skin:    General: Skin is warm and dry.  Neurological:     General: No focal deficit present.     Mental Status: He is alert.     GCS:  GCS eye subscore is 4. GCS verbal subscore is 5. GCS motor subscore is 6.     Sensory: No sensory deficit.     Motor: No weakness.     Gait: Gait normal.      ED Treatments / Results  Labs (all labs ordered are listed, but only abnormal results are displayed) Labs Reviewed - No data to display  EKG None  Radiology No results found.  Procedures Procedures (including critical care time)  Medications Ordered in ED Medications - No data to display   Initial Impression / Assessment and Plan / ED Course  I have reviewed the triage vital signs and the nursing notes.  Pertinent labs & imaging results that were available during my care of the patient were reviewed by me and considered in my medical decision making (see chart for details).    Patient with no red flags on hx or exam. They understand the need for further evaluation with their primary care provider and symptoms to watch out for to return to emergency department.   Final Clinical Impressions(s) / ED Diagnoses   Final diagnoses:  Strain of lumbar region, initial encounter    ED Discharge Orders         Ordered    ibuprofen (ADVIL,MOTRIN) 800 MG tablet  Every 8 hours PRN     02/10/18 2147    methocarbamol (ROBAXIN) 500 MG tablet  Every 8 hours PRN     02/10/18 2147           Terrilee FilesButler,  C, MD 02/10/18 [phone]

## 2018-05-26 ENCOUNTER — Emergency Department (HOSPITAL_BASED_OUTPATIENT_CLINIC_OR_DEPARTMENT_OTHER)
Admission: EM | Admit: 2018-05-26 | Discharge: 2018-05-26 | Disposition: A | Discharge status: Home / Self Care | Payer: Self-pay | Attending: Emergency Medicine | Admitting: Emergency Medicine

## 2018-05-26 ENCOUNTER — Other Ambulatory Visit: Payer: Self-pay

## 2018-05-26 ENCOUNTER — Encounter (HOSPITAL_BASED_OUTPATIENT_CLINIC_OR_DEPARTMENT_OTHER): Payer: Self-pay | Admitting: Emergency Medicine

## 2018-05-26 DIAGNOSIS — I10 Essential (primary) hypertension: Secondary | ICD-10-CM | POA: Insufficient documentation

## 2018-05-26 DIAGNOSIS — Y939 Activity, unspecified: Secondary | ICD-10-CM | POA: Insufficient documentation

## 2018-05-26 DIAGNOSIS — Z7984 Long term (current) use of oral hypoglycemic drugs: Secondary | ICD-10-CM | POA: Insufficient documentation

## 2018-05-26 DIAGNOSIS — E119 Type 2 diabetes mellitus without complications: Secondary | ICD-10-CM | POA: Insufficient documentation

## 2018-05-26 DIAGNOSIS — Z79899 Other long term (current) drug therapy: Secondary | ICD-10-CM | POA: Insufficient documentation

## 2018-05-26 DIAGNOSIS — T148XXA Other injury of unspecified body region, initial encounter: Secondary | ICD-10-CM

## 2018-05-26 DIAGNOSIS — Y929 Unspecified place or not applicable: Secondary | ICD-10-CM | POA: Insufficient documentation

## 2018-05-26 DIAGNOSIS — Y999 Unspecified external cause status: Secondary | ICD-10-CM | POA: Insufficient documentation

## 2018-05-26 DIAGNOSIS — X58XXXA Exposure to other specified factors, initial encounter: Secondary | ICD-10-CM | POA: Insufficient documentation

## 2018-05-26 DIAGNOSIS — S76211A Strain of adductor muscle, fascia and tendon of right thigh, initial encounter: Secondary | ICD-10-CM | POA: Insufficient documentation

## 2018-05-26 DIAGNOSIS — R1031 Right lower quadrant pain: Secondary | ICD-10-CM

## 2018-05-26 MED ORDER — KETOROLAC TROMETHAMINE 30 MG/ML IJ SOLN
30.0000 mg | Freq: Once | INTRAMUSCULAR | Status: AC
  Administered 2018-05-26: 30 mg via INTRAMUSCULAR
  Filled 2018-05-26: qty 1

## 2018-05-26 MED ORDER — IBUPROFEN 600 MG PO TABS: 600.0000 mg | ORAL_TABLET | Freq: Four times a day (QID) | ORAL | 0 refills | Status: AC | PRN

## 2018-05-26 NOTE — ED Provider Notes (Signed)
[facility] EMERGENCY DEPARTMENT Provider Note   CSN: [id number] Arrival date & time: 05/26/18  0216    History   Chief Complaint Chief Complaint  Patient presents with  . Groin Pain    HPI Randy Powers is a 43 y.o. male.     HPI  This is a 43 year old male with history of diabetes, hypertension, hyperlipidemia who presents with right groin pain.  Patient reports 2-week history of right groin pain.  Pain is worse with certain movements.  He denies any specific injury.  He has not noted any mass, swelling, redness.  Currently rates his pain 8 out of 10.  He has been taking Aleve with minimal relief.  At baseline he has some numbness in his feet secondary to diabetes but no new numbness or tingling.  He denies fevers.  He denies hematuria, dysuria, back pain.  He denies scrotal swelling.  Past Medical History:  Diagnosis Date  . Diabetes mellitus without complication (HCC)   . Hyperlipidemia   . Hypertension     There are no active problems to display for this patient.   History reviewed. No pertinent surgical history.      Home Medications    Prior to Admission medications   Medication Sig Start Date End Date Taking? Authorizing Provider  ibuprofen (ADVIL) 600 MG tablet Take 1 tablet (600 mg total) by mouth every 6 (six) hours as needed. 05/26/18   Houa Nie, Mayer Maskerourtney F, MD  lisinopril (PRINIVIL,ZESTRIL) 10 MG tablet Take 1 tablet (10 mg total) by mouth daily. 01/24/14   Charlynne PanderYao, David Hsienta, MD  metFORMIN (GLUCOPHAGE) 850 MG tablet Take 1 tablet (850 mg total) by mouth 2 (two) times daily with a meal. 01/24/14   Charlynne PanderYao, David Hsienta, MD  methocarbamol (ROBAXIN) 500 MG tablet Take 1 tablet (500 mg total) by mouth every 8 (eight) hours as needed for muscle spasms. 02/10/18   Terrilee FilesButler, Michael C, MD  pravastatin (PRAVACHOL) 20 MG tablet Take 1 tablet (20 mg total) by mouth at bedtime. 01/24/14   Charlynne PanderYao, David Hsienta, MD  TESTOSTERONE IM Inject into the muscle.    [provider]    Family History History reviewed. No pertinent family history.  Social History Social History   Tobacco Use  . Smoking status: Never Smoker  . Smokeless tobacco: Never Used  Substance Use Topics  . Alcohol use: Yes    Comment: occ  . Drug use: No     Allergies   Patient has no known allergies.   Review of Systems Review of Systems  Constitutional: Negative for fever.  Respiratory: Negative for shortness of breath.   Cardiovascular: Negative for chest pain.  Gastrointestinal: Negative for abdominal pain, nausea and vomiting.  Genitourinary: Negative for dysuria, hematuria and scrotal swelling.  Musculoskeletal:       Right groin pain  Skin: Negative for color change.  Neurological: Negative for weakness and numbness.  All other systems reviewed and are negative.    Physical Exam Updated Vital Signs BP 133/83 (BP Location: Left Arm)   Pulse 86   Temp 98.5 F (36.9 C) (Oral)   Resp 18   Ht 1.892 m (6' 2.5")   Wt 120.2 kg   SpO2 95%   BMI 33.57 kg/m   Physical Exam Vitals signs and nursing note reviewed.  Constitutional:      Appearance: He is well-developed. He is not ill-appearing.  HENT:     Head: Normocephalic and atraumatic.  Neck:     Musculoskeletal:  Neck supple.  Cardiovascular:     Rate and Rhythm: Normal rate and regular rhythm.  Pulmonary:     Effort: Pulmonary effort is normal. No respiratory distress.  Abdominal:     Palpations: Abdomen is soft.     Tenderness: There is no abdominal tenderness.     Hernia: No hernia is present.  Genitourinary:    Penis: Normal.      Comments: Normal circumcised penis, no scrotal swelling noted Musculoskeletal:     Left lower leg: No edema.     Comments: Tenderness to palpation right medial groin just below the inguinal crease, no overlying skin changes, no mass  Skin:    General: Skin is warm and dry.  Neurological:     Mental Status: He is alert and oriented to person, place, and  time.  Psychiatric:        Mood and Affect: Mood normal.      ED Treatments / Results  Labs (all labs ordered are listed, but only abnormal results are displayed) Labs Reviewed - No data to display  EKG None  Radiology No results found.  Procedures Procedures (including critical care time)  Medications Ordered in ED Medications  ketorolac (TORADOL) 30 MG/ML injection 30 mg (has no administration in time range)     Initial Impression / Assessment and Plan / ED Course  I have reviewed the triage vital signs and the nursing notes.  Pertinent labs & imaging results that were available during my care of the patient were reviewed by me and considered in my medical decision making (see chart for details).        Patient presents with 2-week history of right groin pain.  He is overall nontoxic and vital signs are reassuring.  Exam shows no evidence of hernia or overlying skin changes to suggest infections.  Given point tenderness in location, would suspect musculoskeletal strain.  Recommend anti-inflammatories and heat or ice.  Patient stated understanding.  After history, exam, and medical workup I feel the patient has been appropriately medically screened and is safe for discharge home. Pertinent diagnoses were discussed with the patient. Patient was given return precautions.  Final Clinical Impressions(s) / ED Diagnoses   Final diagnoses:  Rt groin pain  Muscle strain    ED Discharge Orders         Ordered    ibuprofen (ADVIL) 600 MG tablet  Every 6 hours PRN     05/26/18 0245           Shon Baton, MD 05/26/18 [phone]

## 2018-05-26 NOTE — Discharge Instructions (Addendum)
You were seen today for right groin pain.  This is likely musculoskeletal.  Apply heat or ice.  Take ibuprofen as needed.  If you develop any fevers, redness, mass, any new or worsening symptoms you should be reevaluated.

## 2018-05-26 NOTE — ED Triage Notes (Addendum)
Reports R sided groin pain x2 weeks. Concerned for hernia. Aleve not effective for pain. No masses, no urinary symptoms. Denies sick contacts and fever.

## 2018-05-26 NOTE — ED Notes (Signed)
Wants work note

## 2018-05-27 ENCOUNTER — Telehealth (HOSPITAL_BASED_OUTPATIENT_CLINIC_OR_DEPARTMENT_OTHER): Payer: Self-pay | Admitting: Emergency Medicine

## 2019-12-29 ENCOUNTER — Encounter (HOSPITAL_BASED_OUTPATIENT_CLINIC_OR_DEPARTMENT_OTHER): Payer: Self-pay | Admitting: *Deleted

## 2019-12-29 ENCOUNTER — Other Ambulatory Visit: Payer: Self-pay

## 2019-12-29 ENCOUNTER — Emergency Department (HOSPITAL_BASED_OUTPATIENT_CLINIC_OR_DEPARTMENT_OTHER)
Admission: EM | Admit: 2019-12-29 | Discharge: 2019-12-29 | Disposition: A | Discharge status: Home / Self Care | Payer: HRSA Program | Attending: Emergency Medicine | Admitting: Emergency Medicine

## 2019-12-29 DIAGNOSIS — U071 COVID-19: Secondary | ICD-10-CM | POA: Diagnosis not present

## 2019-12-29 DIAGNOSIS — Z79899 Other long term (current) drug therapy: Secondary | ICD-10-CM | POA: Diagnosis not present

## 2019-12-29 DIAGNOSIS — I10 Essential (primary) hypertension: Secondary | ICD-10-CM | POA: Diagnosis not present

## 2019-12-29 DIAGNOSIS — Z7984 Long term (current) use of oral hypoglycemic drugs: Secondary | ICD-10-CM | POA: Insufficient documentation

## 2019-12-29 DIAGNOSIS — E119 Type 2 diabetes mellitus without complications: Secondary | ICD-10-CM | POA: Insufficient documentation

## 2019-12-29 DIAGNOSIS — R6883 Chills (without fever): Secondary | ICD-10-CM | POA: Diagnosis present

## 2019-12-29 LAB — RESP PANEL BY RT-PCR (FLU A&B, COVID) ARPGX2
Influenza A by PCR: NEGATIVE
Influenza B by PCR: NEGATIVE
SARS Coronavirus 2 by RT PCR: POSITIVE — AB

## 2019-12-29 NOTE — Discharge Instructions (Signed)
COVID-19 isolation recommendations  Patients who have symptoms consistent with COVID-19 should self isolate until: At least 3 days (72 hours) have passed since recovery, defined as resolution of fever without the use of fever reducing medications and improvement in respiratory symptoms (e.g., cough, shortness of breath), and At least 7 days have passed since symptoms first appeared. Retesting is not required and not recommended as patients can continue to test positive for several weeks despite lack of symptoms.   COVID-19 Home Management:  You have had a positive test for COVID-19. COVID-19 is caused by a virus. Viruses do not require or respond to antibiotics. Treatment is symptomatic care and it is important to note that these symptoms may last for 7-14 days.   Hand washing: Wash your hands throughout the day, but especially before and after touching the face, using the restroom, sneezing, coughing, or touching surfaces that have been coughed or sneezed upon. Hydration: Symptoms of most illnesses will be intensified and complicated by dehydration. Dehydration can also extend the duration of symptoms. Drink plenty of fluids and get plenty of rest. You should be drinking at least half a liter of water an hour to stay hydrated. Electrolyte drinks (ex. Gatorade, Powerade, Pedialyte) are also encouraged. You should be drinking enough fluids to make your urine light yellow, almost clear. If this is not the case, you are not drinking enough water. Please note that some of the treatments indicated below will not be effective if you are not adequately hydrated. Diet: Please concentrate on hydration, however, you may introduce food slowly.  Start with a clear liquid diet, progressed to a full liquid diet, and then bland solids as you are able. Pain or fever: Ibuprofen, Naproxen, or acetaminophen (generic for Tylenol) for pain or fever.  Antiinflammatory medications: Take 600 mg of ibuprofen every 6 hours  or 440 mg (over the counter dose) to 500 mg (prescription dose) of naproxen every 12 hours for the next 3 days. After this time, these medications may be used as needed for pain. Take these medications with food to avoid upset stomach. Choose only one of these medications, do not take them together. Acetaminophen (generic for Tylenol): Should you continue to have additional pain while taking the ibuprofen or naproxen, you may add in acetaminophen as needed. Your daily total maximum amount of acetaminophen from all sources should be limited to 4000mg /day for persons without liver problems, or 2000mg /day for those with liver problems. Diarrhea: May use medications such as loperamide (Imodium) or Bismuth subsalicylate (Pepto-Bismol). Zyrtec or Claritin: May add these medication daily to control underlying symptoms of congestion, sneezing, and other signs of allergies.  These medications are available over-the-counter. Generics: Cetirizine (generic for Zyrtec) and loratadine (generic for Claritin). Fluticasone: Use fluticasone (generic for Flonase), as directed, for nasal and sinus congestion.  This medication is available over-the-counter. Congestion: Plain guaifenesin (generic for plain Mucinex) may help relieve congestion. Saline sinus rinses and saline nasal sprays may also help relieve congestion.  Sore throat: Warm liquids or Chloraseptic spray may help soothe a sore throat. Gargle twice a day with a salt water solution made from a half teaspoon of salt in a cup of warm water.  Follow up: Follow up with a primary care provider within the next two weeks should symptoms fail to resolve. Return: Return to the ED for significantly worsening symptoms, shortness of breath, persistent/worsening chest pain, persistent vomiting, large amounts of blood in stool, worsening/localized abdominal pain, or any other major concerns.  For prescription  assistance, may try using prescription discount sites or apps, such as  goodrx.com

## 2019-12-29 NOTE — ED Provider Notes (Signed)
[facility] EMERGENCY DEPARTMENT Provider Note   CSN: [id number] Arrival date & time: 12/29/19  1429     History Chief Complaint  Patient presents with  . Covid symptoms    Randy Powers is a 44 y.o. male.  HPI      Randy Powers is a 44 y.o. male, with a history of DM, hyperlipidemia, HTN, presenting to the ED with chills requesting testing for Covid.  No Covid vaccination. Denies fever, shortness of breath, cough, chest pain, abdominal pain, N/V/D, or any other complaints.     Past Medical History:  Diagnosis Date  . Diabetes mellitus without complication (HCC)   . Hyperlipidemia   . Hypertension     There are no problems to display for this patient.   History reviewed. No pertinent surgical history.     No family history on file.  Social History   Tobacco Use  . Smoking status: Never Smoker  . Smokeless tobacco: Never Used  Vaping Use  . Vaping Use: Never used  Substance Use Topics  . Alcohol use: Yes    Comment: occ  . Drug use: No    Home Medications Prior to Admission medications   Medication Sig Start Date End Date Taking? Authorizing Provider  atorvastatin (LIPITOR) 10 MG tablet 10 mg at bedtime. 10/27/18  Yes [provider]  lisinopril-hydrochlorothiazide (ZESTORETIC) 20-12.5 MG tablet Take 1 tablet by mouth daily before breakfast. 10/27/18  Yes [provider]  metFORMIN (GLUCOPHAGE-XR) 500 MG 24 hr tablet Take 1,000 mg by mouth 2 (two) times daily. 10/08/19  Yes [provider]  ibuprofen (ADVIL) 600 MG tablet Take 1 tablet (600 mg total) by mouth every 6 (six) hours as needed. 05/26/18   Horton, Mayer Masker, MD  lisinopril (PRINIVIL,ZESTRIL) 10 MG tablet Take 1 tablet (10 mg total) by mouth daily. 01/24/14   Charlynne Pander, MD  metFORMIN (GLUCOPHAGE) 850 MG tablet Take 1 tablet (850 mg total) by mouth 2 (two) times daily with a meal. 01/24/14   Charlynne Pander, MD  methocarbamol (ROBAXIN) 500 MG tablet  Take 1 tablet (500 mg total) by mouth every 8 (eight) hours as needed for muscle spasms. 02/10/18   Terrilee Files, MD  pravastatin (PRAVACHOL) 20 MG tablet Take 1 tablet (20 mg total) by mouth at bedtime. 01/24/14   Charlynne Pander, MD  TESTOSTERONE IM Inject into the muscle.    [provider]    Allergies    Patient has no known allergies.  Review of Systems   Review of Systems  Constitutional: Positive for chills. Negative for fever.  Respiratory: Negative for cough and shortness of breath.   Cardiovascular: Negative for chest pain and leg swelling.  Gastrointestinal: Negative for abdominal pain, diarrhea, nausea and vomiting.  Musculoskeletal: Negative for myalgias.  All other systems reviewed and are negative.   Physical Exam Updated Vital Signs BP 125/66 (BP Location: Right Arm)   Pulse 87   Temp 99.4 F (37.4 C)   Resp 18   SpO2 96%   Physical Exam Vitals and nursing note reviewed.  Constitutional:      General: He is not in acute distress.    Appearance: He is well-developed. He is not diaphoretic.  HENT:     Head: Normocephalic and atraumatic.     Mouth/Throat:     Mouth: Mucous membranes are moist.     Pharynx: Oropharynx is clear.  Eyes:     Conjunctiva/sclera: Conjunctivae normal.  Cardiovascular:  Rate and Rhythm: Normal rate and regular rhythm.     Pulses: Normal pulses.          Radial pulses are 2+ on the right side and 2+ on the left side.     Heart sounds: Normal heart sounds.  Pulmonary:     Effort: Pulmonary effort is normal. No respiratory distress.     Breath sounds: Normal breath sounds.  Abdominal:     Palpations: Abdomen is soft.     Tenderness: There is no abdominal tenderness. There is no guarding.  Musculoskeletal:     Cervical back: Neck supple.  Skin:    General: Skin is warm and dry.  Neurological:     Mental Status: He is alert.  Psychiatric:        Mood and Affect: Mood and affect normal.        Speech: Speech  normal.        Behavior: Behavior normal.     ED Results / Procedures / Treatments   Labs (all labs ordered are listed, but only abnormal results are displayed) Labs Reviewed  RESP PANEL BY RT-PCR (FLU A&B, COVID) ARPGX2 - Abnormal; Notable for the following components:      Result Value   SARS Coronavirus 2 by RT PCR POSITIVE (*)    All other components within normal limits    EKG None  Radiology No results found.  Procedures Procedures (including critical care time)  Medications Ordered in ED Medications - No data to display  ED Course  I have reviewed the triage vital signs and the nursing notes.  Pertinent labs & imaging results that were available during my care of the patient were reviewed by me and considered in my medical decision making (see chart for details).    MDM Rules/Calculators/A&P                          Patient presents with complaint of chills. Patient is nontoxic appearing, afebrile, not tachycardic on my exam, not tachypneic, not hypotensive, maintains excellent SPO2 on room air, and is in no apparent distress.   Covid test positive. Declines any additional treatment, such as MAB infusion.  Vitals:   12/29/19 1436 12/29/19 1552 12/29/19 1645  BP: 115/79 125/66 125/66  Pulse: (!) 109 87 81  Resp: 16 18 16   Temp: 99.4 F (37.4 C)    SpO2: 98% 96% 99%     Randy Powers was evaluated in Emergency Department on 12/29/2019 for the symptoms described in the history of present illness. He was evaluated in the context of the global COVID-19 pandemic, which necessitated consideration that the patient might be at risk for infection with the SARS-CoV-2 virus that causes COVID-19. Institutional protocols and algorithms that pertain to the evaluation of patients at risk for COVID-19 are in a state of rapid change based on information released by regulatory bodies including the CDC and federal and state organizations. These policies and algorithms were  followed during the patient's care in the ED.  Final Clinical Impression(s) / ED Diagnoses Final diagnoses:  COVID-19    Rx / DC Orders ED Discharge Orders    None       14/07/2019 12/29/19 1805    14/07/21, MD 12/29/19 1815

## 2019-12-29 NOTE — ED Triage Notes (Signed)
C/o chills, body aches and cough xx 3 days

## 2019-12-30 ENCOUNTER — Telehealth: Payer: Self-pay | Admitting: Nurse Practitioner

## 2019-12-30 NOTE — Telephone Encounter (Signed)
Called to Discuss with patient about Covid symptoms and the use of the monoclonal antibody infusion for those with mild to moderate Covid symptoms and at a high risk of hospitalization.     Pt appears to qualify for this infusion due to co-morbid conditions and/or a member of an at-risk group in accordance with the FDA Emergency Use Authorization.    Spoke with patient- he is asymptomatic and declines infusion at this time.   Encouraged him to call infusion clinic hotline if symptoms return within the next 7 days (symptom onset was 3 days ago).

## 2023-04-01 ENCOUNTER — Other Ambulatory Visit: Payer: Self-pay | Admitting: Nurse Practitioner

## 2023-04-01 ENCOUNTER — Ambulatory Visit (INDEPENDENT_AMBULATORY_CARE_PROVIDER_SITE_OTHER): Payer: Worker's Compensation

## 2023-04-01 DIAGNOSIS — S8261XA Displaced fracture of lateral malleolus of right fibula, initial encounter for closed fracture: Secondary | ICD-10-CM | POA: Diagnosis not present

## 2023-04-01 DIAGNOSIS — M25571 Pain in right ankle and joints of right foot: Secondary | ICD-10-CM
